# Patient Record
Sex: Male | Born: 1975 | Race: Black or African American | Hispanic: No | Marital: Single | Smoking: Current some day smoker
Health system: Southern US, Community
[De-identification: ages and names within clinical notes are randomized; demographics above are authoritative.]

## PROBLEM LIST (undated history)

## (undated) DIAGNOSIS — I10 Essential (primary) hypertension: Secondary | ICD-10-CM

## (undated) HISTORY — DX: Essential (primary) hypertension: I10

---

## 2008-01-27 ENCOUNTER — Ambulatory Visit: Payer: Self-pay | Admitting: Family Medicine

## 2008-02-02 ENCOUNTER — Ambulatory Visit: Payer: Self-pay | Admitting: Family Medicine

## 2008-02-12 ENCOUNTER — Ambulatory Visit: Payer: Self-pay | Admitting: Family Medicine

## 2008-02-18 ENCOUNTER — Emergency Department (HOSPITAL_COMMUNITY): Admission: EM | Admit: 2008-02-18 | Discharge: 2008-02-18 | Payer: Self-pay | Admitting: Emergency Medicine

## 2008-04-26 ENCOUNTER — Ambulatory Visit: Payer: Self-pay | Admitting: Family Medicine

## 2008-06-30 ENCOUNTER — Encounter: Admission: RE | Admit: 2008-06-30 | Discharge: 2008-06-30 | Payer: Self-pay | Admitting: Internal Medicine

## 2010-01-11 IMAGING — RF DG UGI W/ HIGH DENSITY W/KUB
15 of 20 series · 18 of 24 positions shown · IV contrast (agent unspecified)
Comparison: None

CLINICAL DATA: Epigastric pain and loss of appetite.

UPPER GI SERIES W/HIGH DENSITY W/KUB
TECHNIQUE: After obtaining a scout radiograph, upper GI series
performed with high density barium and effervescent agent. Thin
barium also used.
Fluoroscopy Time: 3.6 minutes.
Contrast: Barium

[Series 1: run · 4 of 24 slices shown (1 of 14)]
[im 1/24]
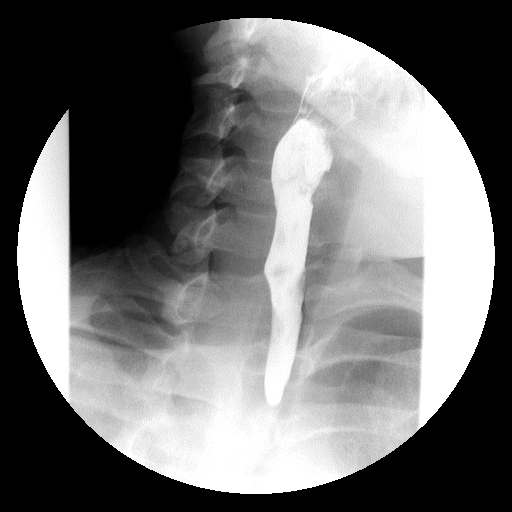
[im 12/24]
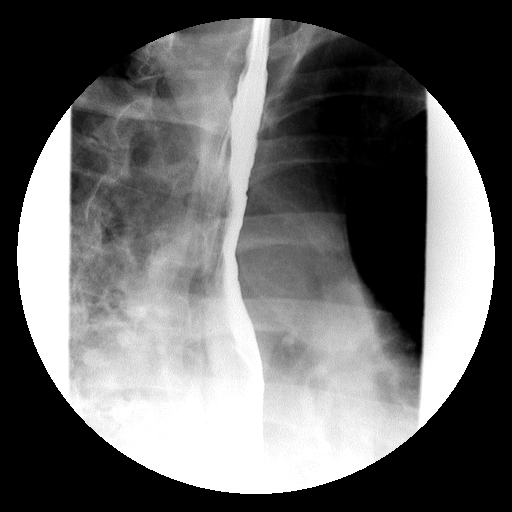
[im 18/24]
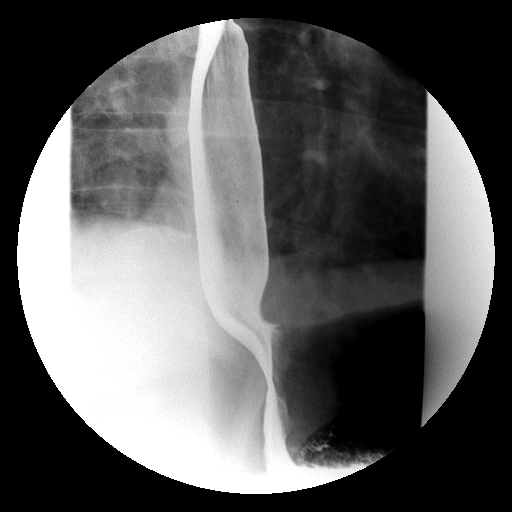
[im 24/24]
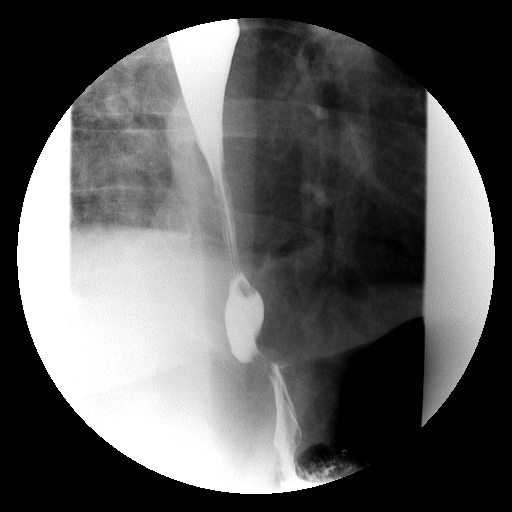

[Series 3: run · 1 of 1 slices shown (2 of 14)]
[im 1/1]
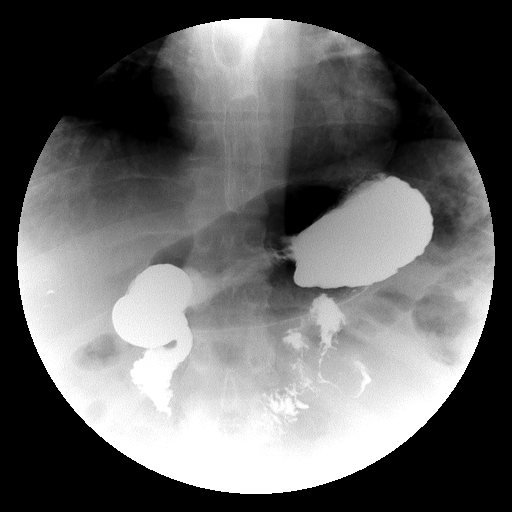

[Series 4: run · 1 of 1 slices shown (3 of 14)]
[im 1/1]
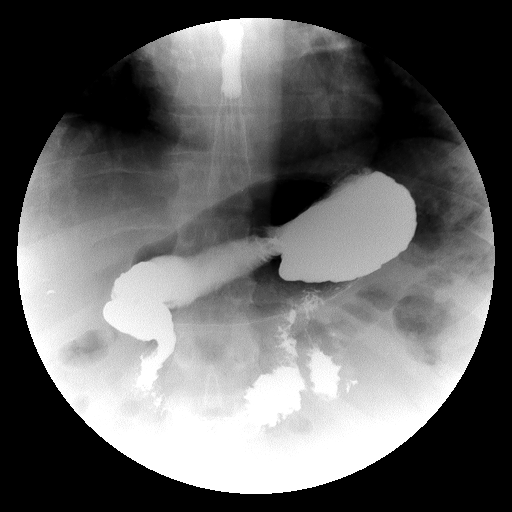

[Series 5: run · 1 of 1 slices shown (4 of 14)]
[im 1/1]
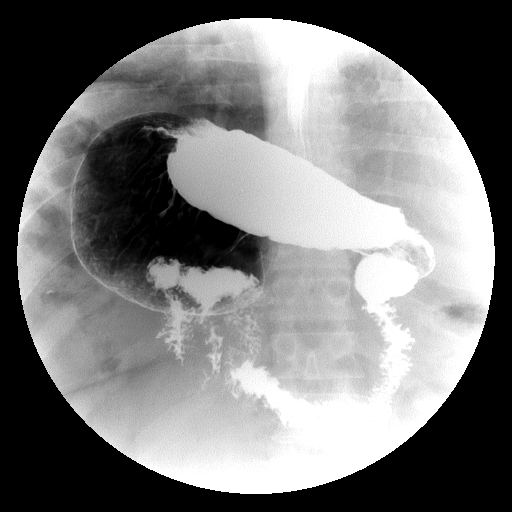

[Series 7: run · 1 of 1 slices shown (5 of 14)]
[im 1/1]
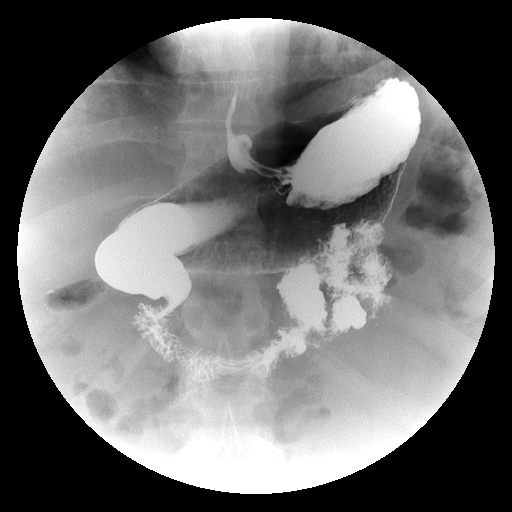

[Series 8: run · 1 of 1 slices shown (6 of 14)]
[im 1/1]
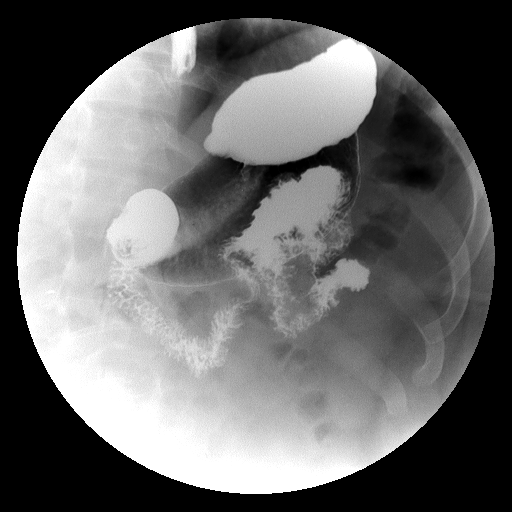

[Series 9: run · 1 of 1 slices shown (7 of 14)]
[im 1/1]
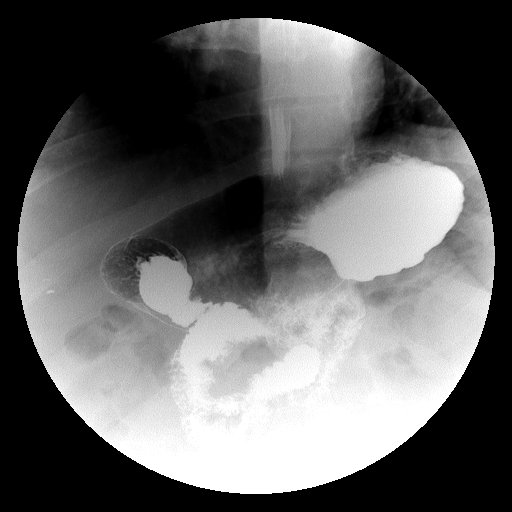

[Series 11: run · 1 of 1 slices shown (8 of 14)]
[im 1/1]
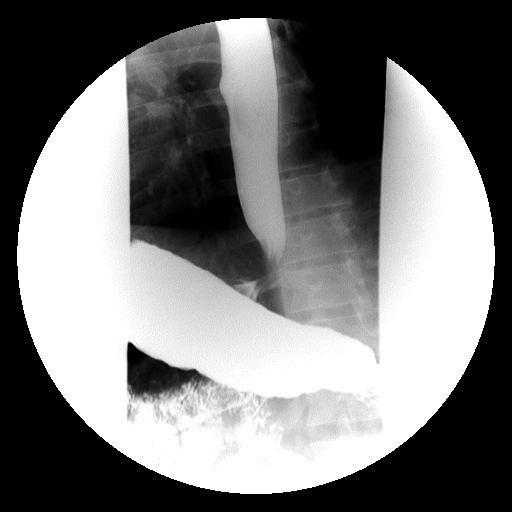

[Series 12: run · 1 of 1 slices shown (9 of 14)]
[im 1/1]
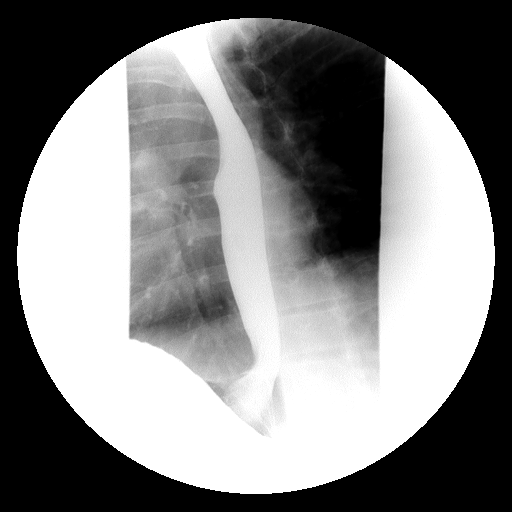

[Series 13: run · 1 of 1 slices shown (10 of 14)]
[im 1/1]
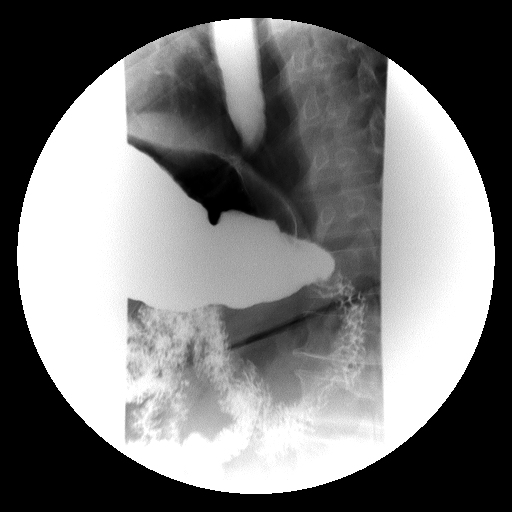

[Series 15: run · 1 of 1 slices shown (11 of 14)]
[im 1/1]
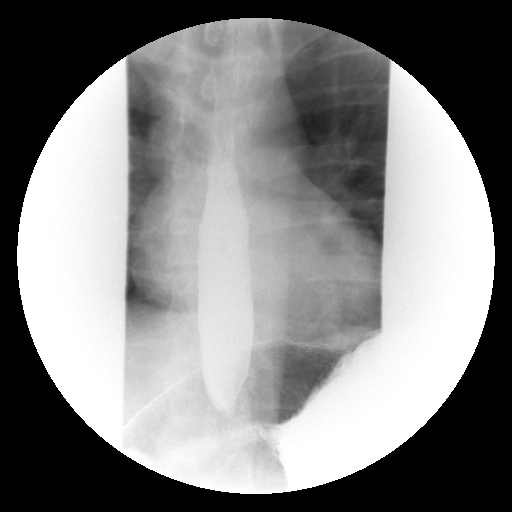

[Series 16: run · 1 of 1 slices shown (12 of 14)]
[im 1/1]
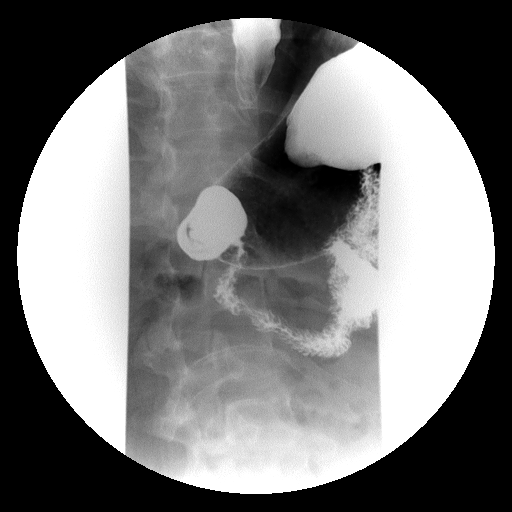

[Series 17: run · 1 of 1 slices shown (13 of 14)]
[im 1/1]
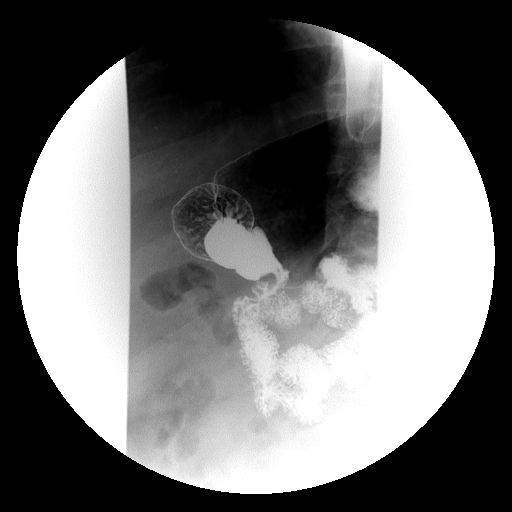

[Series 19: run · 1 of 1 slices shown (14 of 14)]
[im 1/1]
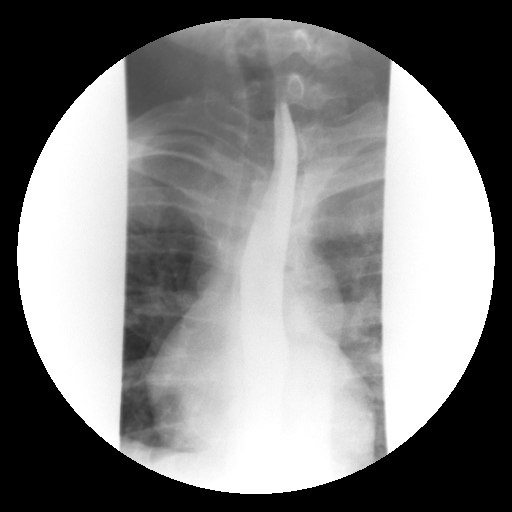

[Series 1001: view not recorded · 0.20mm/px · 1 of 1 slices shown]
[im 1/1]
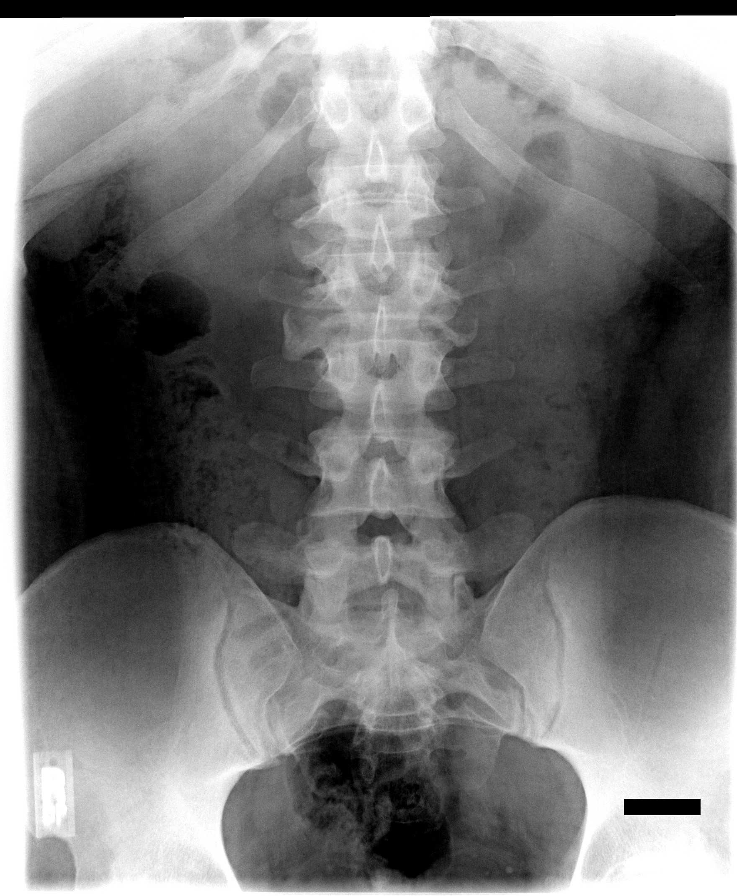

[18 of 24 positions shown; findings below may reference images not displayed]

FINDINGS: The esophageal mucosa and motility are normal.  There is
no stricture or mass.  There is no hiatal hernia.  There is
moderate gastroesophageal reflux up to the cervical esophagus.  The
stomach appears normal.  The duodenum is normal there is no ulcer
or mass.
IMPRESSION: Moderate gastroesophageal reflux.

## 2010-10-24 ENCOUNTER — Other Ambulatory Visit: Payer: Self-pay | Admitting: Internal Medicine

## 2010-10-24 DIAGNOSIS — N63 Unspecified lump in unspecified breast: Secondary | ICD-10-CM

## 2010-10-26 ENCOUNTER — Ambulatory Visit
Admission: RE | Admit: 2010-10-26 | Discharge: 2010-10-26 | Disposition: A | Discharge status: Home / Self Care | Payer: 59 | Source: Ambulatory Visit | Attending: Internal Medicine | Admitting: Internal Medicine

## 2010-10-26 ENCOUNTER — Other Ambulatory Visit: Payer: Self-pay | Admitting: Internal Medicine

## 2010-10-26 DIAGNOSIS — N63 Unspecified lump in unspecified breast: Secondary | ICD-10-CM

## 2010-10-26 DIAGNOSIS — N6310 Unspecified lump in the right breast, unspecified quadrant: Secondary | ICD-10-CM

## 2010-10-30 ENCOUNTER — Ambulatory Visit
Admission: RE | Admit: 2010-10-30 | Discharge: 2010-10-30 | Disposition: A | Discharge status: Home / Self Care | Payer: 59 | Source: Ambulatory Visit | Attending: Internal Medicine | Admitting: Internal Medicine

## 2010-10-30 ENCOUNTER — Other Ambulatory Visit: Payer: Self-pay | Admitting: Radiology

## 2010-10-30 ENCOUNTER — Other Ambulatory Visit: Payer: Self-pay | Admitting: Internal Medicine

## 2010-10-30 DIAGNOSIS — N6310 Unspecified lump in the right breast, unspecified quadrant: Secondary | ICD-10-CM

## 2011-01-09 LAB — COMPREHENSIVE METABOLIC PANEL
ALT: 33
AST: 20
Albumin: 3.9
Alkaline Phosphatase: 94
BUN: 4 — ABNORMAL LOW
CO2: 28
Calcium: 9.5
Chloride: 105
Creatinine, Ser: 0.91
GFR calc Af Amer: >60
GFR calc non Af Amer: >60
Glucose, Bld: 126 — ABNORMAL HIGH
Potassium: 3.9
Sodium: 140
Total Bilirubin: 0.9
Total Protein: 7.4

## 2011-01-09 LAB — DIFFERENTIAL
Basophils Absolute: 0
Basophils Relative: 0
Eosinophils Absolute: 0.2
Eosinophils Relative: 2
Lymphocytes Relative: 16
Lymphs Abs: 1.5
Monocytes Absolute: 0.6
Monocytes Relative: 6
Neutro Abs: 7
Neutrophils Relative %: 75

## 2011-01-09 LAB — CBC
HCT: 47.6
Hemoglobin: 15.8
MCHC: 33.2
MCV: 84.8
Platelets: 254
RBC: 5.61
RDW: 14.1
WBC: 9.3

## 2011-01-09 LAB — OCCULT BLOOD X 1 CARD TO LAB, STOOL: Fecal Occult Bld: NEGATIVE

## 2014-02-02 ENCOUNTER — Ambulatory Visit: Payer: 59 | Admitting: *Deleted

## 2014-02-05 ENCOUNTER — Encounter: Payer: 59 | Attending: Internal Medicine | Admitting: *Deleted

## 2014-02-05 ENCOUNTER — Encounter: Payer: Self-pay | Admitting: *Deleted

## 2014-02-05 DIAGNOSIS — Z713 Dietary counseling and surveillance: Secondary | ICD-10-CM | POA: Diagnosis not present

## 2014-02-05 NOTE — Progress Notes (Signed)
  Medical Nutrition Therapy:  Appt start time: 0900 end time:  0930.   Assessment:  Primary concerns today: Leonard Briggs is here for nutrition counseling pertaining to weight management.  He lost 50-60 pounds in the past through diet and exercise.  He states he has always been obese as an adult.  His heaviest as an adult is 394 and lightest weight is 303 pounds.  His normal is 315-320 pounds.  He would like to weigh 225 pounds.  Leonard Briggs states that his dad is thin, but his brothers are all heavy, though not as heavy as him.  He has lost weight in the past year (50 pounds) He and his wife share grocery shopping and he does the cooking.  He uses a variety of cooking methods, but tries to bake more and grill in the summer time.  He states he eats out often: [employer], [employer], [employer] for dinner. He states he typically eats in the den while watching tv and thinks he is a fast eater.  He eats with his wife when possible, but he has a night shift and often eats at work.   Preferred Learning Style:   No preference indicated   Learning Readiness:   Change in progress   MEDICATIONS: lisinopril   DIETARY INTAKE:  Usual eating pattern includes 2 meals and 0-1 snacks per day.  Everyday foods include proteins, starches, vegetables.  Avoided foods include none.    24-hr recall:  B ( AM): 2 egg omlete with vegetables and cheese  Snk ( AM): not really  L ( PM): subway sandwich. Usually skips Snk ( PM): maybe crackers D ( PM): fast food (burger and fries or chick fil a combo) or leftovers: chicken, vegetable (green beans or cabbage or cauliflower or broccoli) and rice Snk ( PM): piece of cake or something else sweet sometimes Beverages: flavored water and soda when eats out.  No alcohol  Usual physical activity: 3 miles/day on treadmill currently.  And lifts weight 3-4 days/week  Estimated energy needs: 2400 calories 270 g carbohydrates 180 g protein 67 g fat    Nutritional Diagnosis:  [place]-3.3  Overweight/obesity As related to meal skipping and energy-dense food choices.  As evidenced by BMI>40.    Intervention:  Nutrition counseling provided.  Discussed metabolic effects of meal skipping and discouraged this practice.  Praised Leonard Briggs for the changes he has implemented.  Suggested ways to decrease calories in his dinner choices at fast food restaurants.  Also discussed more mindful eating: eating without distractions and eating more slowly  Goals: Aim for 3 meals each day.  Avoid meal skipping.  Get lunch in every day!! Dinner time choose healthier options: smaller burger, smaller fries, grilled chicken.  Try salad or fruit sometimes  Try diet soda or water as beverage Eat without distractions: turn off tv. Sit at table.  Slow down.  Aim to make meals last 20 minutes :-)   Teaching Method Utilized:  Auditory  Handout given: Eating on the Go  Barriers to learning/adherence to lifestyle change: none  Demonstrated degree of understanding via:  Teach Back   Monitoring/Evaluation:  Dietary intake, exercise, and body weight in 6 week(s).

## 2014-02-05 NOTE — Patient Instructions (Signed)
Aim for 3 meals each day.  Avoid meal skipping.  Get lunch in every day!! Dinner time choose healthier options: smaller burger, smaller fries, grilled chicken.  Try salad or fruit sometimes  Try diet soda or water as beverage Eat without distractions: turn off tv. Sit at table.  Slow down.  Aim to make meals last 20 minutes :-)

## 2014-03-26 ENCOUNTER — Ambulatory Visit: Payer: 59 | Admitting: *Deleted

## 2017-01-30 ENCOUNTER — Ambulatory Visit: Payer: Managed Care, Other (non HMO) | Admitting: Registered"

## 2017-07-12 ENCOUNTER — Encounter: Payer: Managed Care, Other (non HMO) | Attending: Internal Medicine | Admitting: Dietician

## 2017-07-12 ENCOUNTER — Encounter: Payer: Self-pay | Admitting: Dietician

## 2017-07-12 VITALS — Ht 73.0 in | Wt 375.6 lb

## 2017-07-12 DIAGNOSIS — Z6841 Body Mass Index (BMI) 40.0 and over, adult: Secondary | ICD-10-CM

## 2017-07-12 DIAGNOSIS — E669 Obesity, unspecified: Secondary | ICD-10-CM | POA: Insufficient documentation

## 2017-07-12 DIAGNOSIS — Z713 Dietary counseling and surveillance: Secondary | ICD-10-CM | POA: Diagnosis present

## 2017-07-12 DIAGNOSIS — E66813 Obesity, class 3: Secondary | ICD-10-CM

## 2017-07-12 NOTE — Patient Instructions (Signed)
   15g of protein at each meal minimum  Try to eat at least 1 serving of fruit per day, try as a snack after dinner in place of candy  Continue to buy individual serving sizes of snacks like chips, great job!  Drink less soda, look for low or zero calorie beverage options  Daily calorie goal for weight loss: 2200 calories per day to start, decrease by 100-200 calories per day as needed for further weight loss  Diets high in protein and vegetables lead to more satisfaction and fullness at meals. Aim for 1/2 your plate to have vegetables, 1/4 plate protein, 1/4 plate starch/ carb generally  When eating out, there are a few different options to "healthify" the meal  Choose smaller portion sizes  Swap your side  Choose a zero calorie beverage  Add a fruit or vegetable  Choose non-fried items  Eat out less each week & cook at home more often-- the ultimate goal :)

## 2017-07-12 NOTE — Progress Notes (Signed)
Medical Nutrition Therapy: Visit start time: 1530  end time: 1615  Assessment:  Diagnosis: obesity Past medical history: see chart Psychosocial issues/ stress concerns: none Preferred learning method:  . Auditory . Visual . Hands-on  Current weight: 375.6#  Height: 6\' 1"  Medications, supplements: Lisinopril   Progress and evaluation: Patient would like to learn how to eat a healthier diet in general. Also desires weight loss; goal wt 278#. His current diet is high in fried foods, processed foods and he eats out at fast food locations for the majority of his meals. If eating at home, he cooks for himself most of the time and opts for frozen meats and vegetables which require less preparation time. In his daily life he is mostly sedentary. Occasionally he skips breakfast or lunch. Does not eat large portions of snack items but his choices are typically processed as well. Does not buy fruit though states he does not dislike it. He is interested in knowing how many calories he should be consuming daily.   Physical activity: None at the moment. Used to be a member of a gym but stopped going d/t unknown reasons. Plays video games and watches TV frequently.  Dietary Intake:  Usual eating pattern includes 2-3 meals and 2 snacks per day. Dining out frequency: 6-12 meals per week.  Breakfast: [employer] protein shake Snack: n/a  Lunch: fast food combo meal during the week (burger + fries), weekends at home: soup, sandwich, leftovers, etc. Snack: individually-portioned bag of chips Supper: fast food similar to lunch options, pre-cooked frozen chicken + veggies Snack: candy Beverages: 1 soda, water, juice occasionally  Nutrition Care Education: Topics covered: portion control, plate method, planning balanced meals, ways to reduce fat and calories when eating out, estimated daily calorie needs for weight reduction, fiber + protein and satiety, ways to include fruits more regularly, sugar sweetened  beverages, mindful non-distracted eating, nutritious snack options, dietary sources of protein, fresh vs. Frozen vegetables Basic nutrition: basic food groups, appropriate nutrient balance, appropriate meal and snack schedule, general nutrition guidelines    Weight control: benefits of weight control, behavioral changes for weight loss Advanced nutrition: cooking techniques, dining out Other lifestyle changes:  benefits of making changes, increasing motivation, readiness for change, identifying habits that need to change  Nutritional Diagnosis:  [place]-3.3 Overweight/obesity As related to lifestyle habits.  As evidenced by BMI 49.55, frequent fast food consumption.  Intervention: Discussion as noted above. Patient is considering making changes to his fast food orders in order to reduce calories and fat from the meal. He was instructed on how to utilize the plate method to plan meals, with emphasis on lean protein and vegetables. He may start to incorporate fruits more often, likely as a snack in place of after dinner candy. He would also like to start drinking less soda.  Education Materials given:  . Proper Portions Guidelines . Tips for Successful Weight Loss . Eating Healthy When Eating Out . Quick and Healthy Meals . Goals/ instructions  Learner/ who was taught:  . Patient  Level of understanding: Marland Kitchen. Verbalizes/ demonstrates competence  Demonstrated degree of understanding via:   Teach back Learning barriers: Marland Kitchen. Motivation lacking  Willingness to learn/ readiness for change: . Hesitance, contemplating change  Monitoring and Evaluation:  Dietary intake, exercise, eating out, and body weight      follow up: prn

## 2019-02-09 ENCOUNTER — Ambulatory Visit (LOCAL_COMMUNITY_HEALTH_CENTER): Payer: Managed Care, Other (non HMO)

## 2019-02-09 ENCOUNTER — Other Ambulatory Visit: Payer: Self-pay

## 2019-02-09 DIAGNOSIS — Z23 Encounter for immunization: Secondary | ICD-10-CM | POA: Diagnosis not present

## 2019-02-09 NOTE — Progress Notes (Signed)
Hep A administered; tolerated well Aileen Fass, RN

## 2022-07-09 ENCOUNTER — Ambulatory Visit: Payer: Self-pay | Admitting: Podiatry

## 2022-07-10 ENCOUNTER — Encounter: Payer: Self-pay | Admitting: Podiatry

## 2022-07-10 ENCOUNTER — Ambulatory Visit: Payer: Managed Care, Other (non HMO) | Admitting: Podiatry

## 2022-07-10 ENCOUNTER — Ambulatory Visit (INDEPENDENT_AMBULATORY_CARE_PROVIDER_SITE_OTHER): Payer: Managed Care, Other (non HMO)

## 2022-07-10 DIAGNOSIS — M2142 Flat foot [pes planus] (acquired), left foot: Secondary | ICD-10-CM

## 2022-07-10 DIAGNOSIS — M2141 Flat foot [pes planus] (acquired), right foot: Secondary | ICD-10-CM

## 2022-07-10 DIAGNOSIS — M216X2 Other acquired deformities of left foot: Secondary | ICD-10-CM | POA: Diagnosis not present

## 2022-07-10 DIAGNOSIS — M216X1 Other acquired deformities of right foot: Secondary | ICD-10-CM

## 2022-07-10 NOTE — Progress Notes (Signed)
   Chief Complaint  Patient presents with   Flat Foot    "I have flat feet.  I have a bunion." N - flat feet L - bilateral D - 30 yrs O - slowly progressed C - sore, sharp pain with bunions sometime A - shoes T - none    Subjective:  47 y.o. male presenting today as a new patient for evaluation of bilateral flatfeet and bunion deformity.  Patient states that he has had flatfeet and bunions for his entire life.  This runs in his family.  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   Past Medical History:  Diagnosis Date   Hypertension        Objective/Physical Exam General: The patient is alert and oriented x3 in no acute distress.  Dermatology: Skin is warm, dry and supple bilateral lower extremities. Negative for open lesions or macerations.  Vascular: Palpable pedal pulses bilaterally. No edema or erythema noted. Capillary refill within normal limits.  Neurological: Epicritic and protective threshold grossly intact bilaterally.   Musculoskeletal Exam: Range of motion within normal limits to all pedal and ankle joints bilateral. Muscle strength 5/5 in all groups bilateral. Upon weightbearing there is a medial longitudinal arch collapse bilaterally.  Rear foot foot valgus noted to the bilateral lower extremities with excessive pronation upon mid stance.  Hallux valgus noted bilateral  Radiographic Exam B/L feet 07/10/2022:  Normal osseous mineralization. Joint spaces preserved. No fracture/dislocation/boney destruction.  Increased intermetatarsal angle with a hallux abductus angle noted.  Prominent exostosis also noted to the medial eminence of the first metatarsal head bilateral right greater than the left. Pes planus noted on radiographic exam lateral views. Decreased calcaneal inclination and metatarsal declination angle is noted. Anterior break in the cyma line noted on lateral views. Medial talar head to deviation noted on AP  radiograph.   Assessment: 1. pes planus bilateral 2.  Hallux valgus bilateral   Plan of Care:  1. Patient was evaluated. X-Rays reviewed.  2.  Discussed both conservative and surgical management for the patient.  At the moment he is only wanting to pursue conservative treatment since his symptoms are somewhat tolerable.  Agree with this plan 3.  Appointment with orthotics department for custom molded orthotics/insoles 4.  Continue wearing good supportive shoes and sneakers 5.  Return to clinic for orthotics fitting.  As needed with me  *Pharmacist for [place] M. Orvella Digiulio, [facility]  Dr. Edrick Kins, DPM    [address], [place] [address]                Office [phone]  Fax [phone]

## 2022-07-26 ENCOUNTER — Ambulatory Visit (INDEPENDENT_AMBULATORY_CARE_PROVIDER_SITE_OTHER): Payer: Managed Care, Other (non HMO) | Admitting: *Deleted

## 2022-07-26 DIAGNOSIS — M216X2 Other acquired deformities of left foot: Secondary | ICD-10-CM | POA: Diagnosis not present

## 2022-07-26 DIAGNOSIS — M216X1 Other acquired deformities of right foot: Secondary | ICD-10-CM | POA: Diagnosis not present

## 2022-07-26 DIAGNOSIS — M2142 Flat foot [pes planus] (acquired), left foot: Secondary | ICD-10-CM | POA: Diagnosis not present

## 2022-07-26 DIAGNOSIS — M2141 Flat foot [pes planus] (acquired), right foot: Secondary | ICD-10-CM | POA: Diagnosis not present

## 2022-07-26 NOTE — Progress Notes (Signed)
Patient presents today to be casted for custom molded orthotics. EVANS is the treating physician.  Impression foam cast was taken. ABN signed.  Patient info-  Shoe size: 13W  Height: 6FT 1IN  Weight: 413  Insurance: [employer]   Patient will be notified once orthotics arrive in office and reappoint for fitting at that time.

## 2022-08-17 ENCOUNTER — Other Ambulatory Visit: Payer: Managed Care, Other (non HMO)

## 2023-06-26 ENCOUNTER — Other Ambulatory Visit (HOSPITAL_COMMUNITY): Payer: Self-pay
